# Patient Record
Sex: Male | Born: 1955
Health system: Southern US, Community
[De-identification: ages and names within clinical notes are randomized; demographics above are authoritative.]

## PROBLEM LIST (undated history)

## (undated) DIAGNOSIS — J439 Emphysema, unspecified: Secondary | ICD-10-CM

---

## 2006-04-04 ENCOUNTER — Ambulatory Visit: Payer: Self-pay | Admitting: Internal Medicine

## 2006-04-25 ENCOUNTER — Ambulatory Visit: Payer: Self-pay | Admitting: Internal Medicine

## 2006-05-17 ENCOUNTER — Ambulatory Visit: Payer: Self-pay | Admitting: Otolaryngology

## 2009-12-23 ENCOUNTER — Ambulatory Visit: Payer: Self-pay | Admitting: Surgery

## 2009-12-30 ENCOUNTER — Ambulatory Visit: Payer: Self-pay | Admitting: Family Medicine

## 2010-01-05 ENCOUNTER — Ambulatory Visit: Payer: Self-pay | Admitting: Surgery

## 2010-02-07 ENCOUNTER — Ambulatory Visit: Payer: Self-pay | Admitting: Surgery

## 2011-03-08 IMAGING — CR DG CHEST 1V PORT
1 series · 1 of 1 positions shown · non-contrast
Comparison: none

REASON FOR EXAM: f/u pneumonia, leukocytosis
COMMENTS:

PROCEDURE:     DXR - DXR PORTABLE CHEST SINGLE VIEW  - January 05, 2010  [DATE]
RESULT:     Comparison: 12/30/2009

[view not recorded]
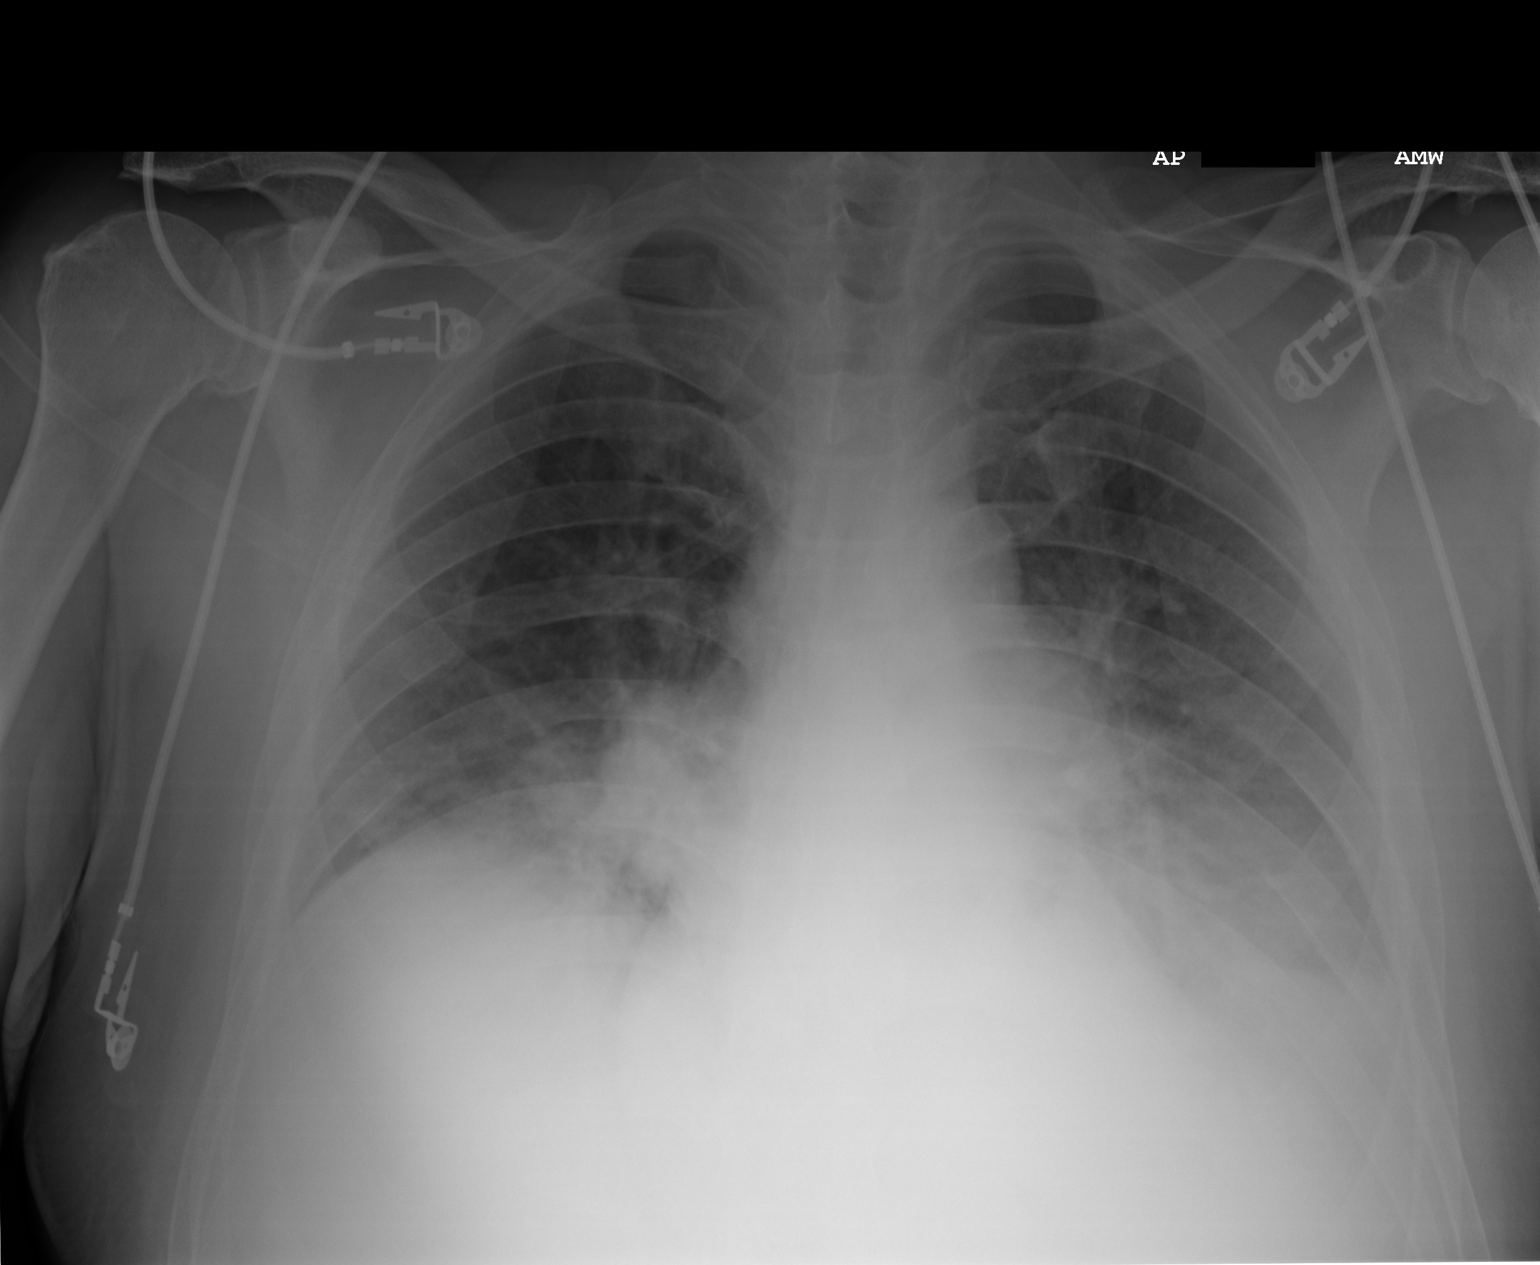

[1 of 1 positions shown; findings below may reference images not displayed]

FINDINGS: The cardiac silhouette is enlarged. There are perihilar and bibasilar
opacities. There is a small left pleural effusion, which is decreased in
size. There is prominence of the central pulmonary vessels.
IMPRESSION: Findings favored to represent pulmonary edema. Infection not excluded.

## 2014-12-15 ENCOUNTER — Other Ambulatory Visit: Payer: Self-pay | Admitting: Family Medicine

## 2014-12-15 DIAGNOSIS — Z87891 Personal history of nicotine dependence: Secondary | ICD-10-CM

## 2014-12-17 ENCOUNTER — Inpatient Hospital Stay: Payer: 59 | Attending: Family Medicine | Admitting: Family Medicine

## 2014-12-17 ENCOUNTER — Ambulatory Visit
Admission: RE | Admit: 2014-12-17 | Discharge: 2014-12-17 | Disposition: A | Discharge status: Home / Self Care | Payer: 59 | Source: Ambulatory Visit | Attending: Family Medicine | Admitting: Family Medicine

## 2014-12-17 DIAGNOSIS — F17210 Nicotine dependence, cigarettes, uncomplicated: Secondary | ICD-10-CM

## 2014-12-17 DIAGNOSIS — K760 Fatty (change of) liver, not elsewhere classified: Secondary | ICD-10-CM | POA: Insufficient documentation

## 2014-12-17 DIAGNOSIS — Z87891 Personal history of nicotine dependence: Secondary | ICD-10-CM

## 2014-12-17 DIAGNOSIS — C7931 Secondary malignant neoplasm of brain: Secondary | ICD-10-CM | POA: Diagnosis not present

## 2014-12-17 DIAGNOSIS — Z122 Encounter for screening for malignant neoplasm of respiratory organs: Secondary | ICD-10-CM | POA: Diagnosis not present

## 2014-12-17 DIAGNOSIS — Z85828 Personal history of other malignant neoplasm of skin: Secondary | ICD-10-CM | POA: Diagnosis not present

## 2014-12-17 DIAGNOSIS — R0602 Shortness of breath: Secondary | ICD-10-CM | POA: Insufficient documentation

## 2014-12-17 NOTE — Progress Notes (Signed)
In accordance with CMS guidelines, patient has meet eligibility criteria including age, absence of signs or symptoms of lung cancer, the specific calculation of cigarette smoking pack-years was 45 years and is a current smoker.   A shared decision-making session was conducted prior to the performance of CT scan. This includes one or more decision aids, includes benefits and harms of screening, follow-up diagnostic testing, over-diagnosis, false positive rate, and total radiation exposure.  Counseling on the importance of adherence to annual lung cancer LDCT screening, impact of co-morbidities, and ability or willingness to undergo diagnosis and treatment is imperative for compliance of the program.  Counseling on the importance of continued smoking cessation for former smokers; the importance of smoking cessation for current smokers and information about tobacco cessation interventions have been given to patient including the [place] at [facility], 1800 quit [place], as well as Cancer Center specific smoking cessation programs.  Written order for lung cancer screening with LDCT has been given to the patient and any and all questions have been answered to the best of my abilities.   Yearly follow up will be scheduled by Shawn Perkins, Thoracic Navigator.   

## 2014-12-20 ENCOUNTER — Telehealth: Payer: Self-pay | Admitting: *Deleted

## 2014-12-20 NOTE — Telephone Encounter (Signed)
  Oncology Nurse Navigator Documentation    Navigator Encounter Type: Telephone;Screening (12/20/14 1200)               Notified patient of LDCT lung cancer screening results of Lung Rads 2  finding with recommendation for 12 month follow up imaging. Also notified of incidental findings noted below.  IMPRESSION: 1. Mild centrilobular emphysema with pulmonary nodules measuring maximally 3 mm. Lung-RADS Category 2, benign appearance or behavior. Continue annual screening with low-dose chest CT without contrast in 12 months 2. Atherosclerosis, including within the coronary arteries. 3. Hepatic steatosis. 4. Moderate left hemidiaphragm elevation with improved adjacent left base volume loss/atelectasis. 5. Probable left adrenal adenoma.

## 2015-05-23 ENCOUNTER — Other Ambulatory Visit: Payer: Self-pay | Admitting: Internal Medicine

## 2015-05-23 DIAGNOSIS — I2510 Atherosclerotic heart disease of native coronary artery without angina pectoris: Secondary | ICD-10-CM

## 2015-05-23 DIAGNOSIS — R0602 Shortness of breath: Secondary | ICD-10-CM

## 2015-05-31 ENCOUNTER — Ambulatory Visit
Admission: RE | Admit: 2015-05-31 | Discharge: 2015-05-31 | Disposition: A | Discharge status: Home / Self Care | Payer: 59 | Source: Ambulatory Visit | Attending: Internal Medicine | Admitting: Internal Medicine

## 2015-05-31 ENCOUNTER — Other Ambulatory Visit: Payer: Self-pay | Admitting: Internal Medicine

## 2015-05-31 ENCOUNTER — Ambulatory Visit: Payer: 59

## 2015-05-31 DIAGNOSIS — I071 Rheumatic tricuspid insufficiency: Secondary | ICD-10-CM | POA: Insufficient documentation

## 2015-05-31 DIAGNOSIS — I340 Nonrheumatic mitral (valve) insufficiency: Secondary | ICD-10-CM | POA: Diagnosis not present

## 2015-05-31 DIAGNOSIS — R0602 Shortness of breath: Secondary | ICD-10-CM | POA: Insufficient documentation

## 2015-05-31 DIAGNOSIS — I2510 Atherosclerotic heart disease of native coronary artery without angina pectoris: Secondary | ICD-10-CM | POA: Diagnosis present

## 2015-05-31 NOTE — Progress Notes (Signed)
*  PRELIMINARY RESULTS* Echocardiogram 2D Echocardiogram has been performed.  Samuel Krause 05/31/2015, 10:39 AM

## 2015-06-01 ENCOUNTER — Encounter: Admission: RE | Admit: 2015-06-01 | Payer: 59 | Source: Ambulatory Visit

## 2015-09-22 DIAGNOSIS — E782 Mixed hyperlipidemia: Secondary | ICD-10-CM | POA: Diagnosis not present

## 2015-09-22 DIAGNOSIS — E669 Obesity, unspecified: Secondary | ICD-10-CM | POA: Diagnosis not present

## 2015-09-22 DIAGNOSIS — I2510 Atherosclerotic heart disease of native coronary artery without angina pectoris: Secondary | ICD-10-CM | POA: Diagnosis not present

## 2015-09-22 DIAGNOSIS — Z6831 Body mass index (BMI) 31.0-31.9, adult: Secondary | ICD-10-CM | POA: Diagnosis not present

## 2015-09-22 DIAGNOSIS — I10 Essential (primary) hypertension: Secondary | ICD-10-CM | POA: Diagnosis not present

## 2015-09-22 DIAGNOSIS — E1129 Type 2 diabetes mellitus with other diabetic kidney complication: Secondary | ICD-10-CM | POA: Diagnosis not present

## 2016-01-11 ENCOUNTER — Telehealth: Payer: Self-pay | Admitting: *Deleted

## 2016-01-11 NOTE — Telephone Encounter (Signed)
Left voicemail for patient notifyng them that it is time to schedule annual low dose lung cancer screening CT scan. Instructed patient to call back to verify information prior to the scan being scheduled.  

## 2016-01-16 ENCOUNTER — Telehealth: Payer: Self-pay | Admitting: *Deleted

## 2016-01-16 NOTE — Telephone Encounter (Signed)
Notified patient that annual lung cancer screening low dose CT scan is due. Confirmed that patient is within the age range of 55-77, and asymptomatic, (no signs or symptoms of lung cancer). Patient denies illness that would prevent curative treatment for lung cancer if found. The patient is a current smoker, with a 46 pack year history. The shared decision making visit was done 12/17/14. Patient is agreeable for CT scan being scheduled.

## 2016-01-27 ENCOUNTER — Other Ambulatory Visit: Payer: Self-pay | Admitting: *Deleted

## 2016-01-27 DIAGNOSIS — Z79899 Other long term (current) drug therapy: Secondary | ICD-10-CM | POA: Diagnosis not present

## 2016-01-27 DIAGNOSIS — E782 Mixed hyperlipidemia: Secondary | ICD-10-CM | POA: Diagnosis not present

## 2016-01-27 DIAGNOSIS — Z125 Encounter for screening for malignant neoplasm of prostate: Secondary | ICD-10-CM | POA: Diagnosis not present

## 2016-01-27 DIAGNOSIS — I10 Essential (primary) hypertension: Secondary | ICD-10-CM | POA: Diagnosis not present

## 2016-01-27 DIAGNOSIS — Z87891 Personal history of nicotine dependence: Secondary | ICD-10-CM

## 2016-01-27 DIAGNOSIS — Z6831 Body mass index (BMI) 31.0-31.9, adult: Secondary | ICD-10-CM | POA: Diagnosis not present

## 2016-01-27 DIAGNOSIS — E1129 Type 2 diabetes mellitus with other diabetic kidney complication: Secondary | ICD-10-CM | POA: Diagnosis not present

## 2016-01-27 DIAGNOSIS — I2510 Atherosclerotic heart disease of native coronary artery without angina pectoris: Secondary | ICD-10-CM | POA: Diagnosis not present

## 2016-02-15 ENCOUNTER — Ambulatory Visit
Admission: RE | Admit: 2016-02-15 | Discharge: 2016-02-15 | Disposition: A | Discharge status: Home / Self Care | Payer: 59 | Source: Ambulatory Visit | Attending: Oncology | Admitting: Oncology

## 2016-02-15 DIAGNOSIS — Z87891 Personal history of nicotine dependence: Secondary | ICD-10-CM | POA: Diagnosis not present

## 2016-02-15 DIAGNOSIS — R911 Solitary pulmonary nodule: Secondary | ICD-10-CM | POA: Diagnosis not present

## 2016-02-15 DIAGNOSIS — F17210 Nicotine dependence, cigarettes, uncomplicated: Secondary | ICD-10-CM | POA: Diagnosis not present

## 2016-02-17 ENCOUNTER — Telehealth: Payer: Self-pay | Admitting: *Deleted

## 2016-02-17 NOTE — Telephone Encounter (Signed)
Notified patient of LDCT lung cancer screening results with recommendation for 12 month follow up imaging. Also notified of incidental finding noted below. Patient verbalizes understanding.   IMPRESSION: Stable 2.1 mm subpleural nodule in the right upper lobe. Lung-RADS Category 2, benign appearance or behavior. Continue annual screening with low-dose chest CT without contrast in 12 months.

## 2016-04-27 DIAGNOSIS — Z9181 History of falling: Secondary | ICD-10-CM | POA: Diagnosis not present

## 2016-04-27 DIAGNOSIS — Z1389 Encounter for screening for other disorder: Secondary | ICD-10-CM | POA: Diagnosis not present

## 2016-04-27 DIAGNOSIS — E782 Mixed hyperlipidemia: Secondary | ICD-10-CM | POA: Diagnosis not present

## 2016-04-27 DIAGNOSIS — Z6831 Body mass index (BMI) 31.0-31.9, adult: Secondary | ICD-10-CM | POA: Diagnosis not present

## 2016-04-27 DIAGNOSIS — E1129 Type 2 diabetes mellitus with other diabetic kidney complication: Secondary | ICD-10-CM | POA: Diagnosis not present

## 2016-04-27 DIAGNOSIS — I10 Essential (primary) hypertension: Secondary | ICD-10-CM | POA: Diagnosis not present

## 2016-04-27 DIAGNOSIS — I2510 Atherosclerotic heart disease of native coronary artery without angina pectoris: Secondary | ICD-10-CM | POA: Diagnosis not present

## 2016-08-31 DIAGNOSIS — Z6832 Body mass index (BMI) 32.0-32.9, adult: Secondary | ICD-10-CM | POA: Diagnosis not present

## 2016-08-31 DIAGNOSIS — E782 Mixed hyperlipidemia: Secondary | ICD-10-CM | POA: Diagnosis not present

## 2016-08-31 DIAGNOSIS — Z720 Tobacco use: Secondary | ICD-10-CM | POA: Diagnosis not present

## 2016-08-31 DIAGNOSIS — I2510 Atherosclerotic heart disease of native coronary artery without angina pectoris: Secondary | ICD-10-CM | POA: Diagnosis not present

## 2016-08-31 DIAGNOSIS — E1129 Type 2 diabetes mellitus with other diabetic kidney complication: Secondary | ICD-10-CM | POA: Diagnosis not present

## 2016-08-31 DIAGNOSIS — I10 Essential (primary) hypertension: Secondary | ICD-10-CM | POA: Diagnosis not present

## 2017-01-01 DIAGNOSIS — J449 Chronic obstructive pulmonary disease, unspecified: Secondary | ICD-10-CM | POA: Diagnosis not present

## 2017-01-01 DIAGNOSIS — E1129 Type 2 diabetes mellitus with other diabetic kidney complication: Secondary | ICD-10-CM | POA: Diagnosis not present

## 2017-01-01 DIAGNOSIS — Z1389 Encounter for screening for other disorder: Secondary | ICD-10-CM | POA: Diagnosis not present

## 2017-01-01 DIAGNOSIS — I10 Essential (primary) hypertension: Secondary | ICD-10-CM | POA: Diagnosis not present

## 2017-01-01 DIAGNOSIS — I2510 Atherosclerotic heart disease of native coronary artery without angina pectoris: Secondary | ICD-10-CM | POA: Diagnosis not present

## 2017-01-01 DIAGNOSIS — Z720 Tobacco use: Secondary | ICD-10-CM | POA: Diagnosis not present

## 2017-01-01 DIAGNOSIS — E782 Mixed hyperlipidemia: Secondary | ICD-10-CM | POA: Diagnosis not present

## 2017-02-05 ENCOUNTER — Telehealth: Payer: Self-pay | Admitting: *Deleted

## 2017-02-05 DIAGNOSIS — Z87891 Personal history of nicotine dependence: Secondary | ICD-10-CM

## 2017-02-05 DIAGNOSIS — Z122 Encounter for screening for malignant neoplasm of respiratory organs: Secondary | ICD-10-CM

## 2017-02-05 NOTE — Telephone Encounter (Signed)
Notified patient that annual lung cancer screening low dose CT scan is due currently or will be in near future. Confirmed that patient is within the age range of 55-77, and asymptomatic, (no signs or symptoms of lung cancer). Patient denies illness that would prevent curative treatment for lung cancer if found. Verified smoking history, (current, 47 pack year). The shared decision making visit was done 12/17/14. Patient is agreeable for CT scan being scheduled.

## 2017-02-05 NOTE — Telephone Encounter (Signed)
Left message for patient to notify them that it is time to schedule annual low dose lung cancer screening CT scan. Instructed patient to call back to verify information prior to the scan being scheduled.  

## 2017-02-21 ENCOUNTER — Ambulatory Visit
Admission: RE | Admit: 2017-02-21 | Discharge: 2017-02-21 | Disposition: A | Discharge status: Home / Self Care | Payer: 59 | Source: Ambulatory Visit | Attending: Oncology | Admitting: Oncology

## 2017-02-21 DIAGNOSIS — I2510 Atherosclerotic heart disease of native coronary artery without angina pectoris: Secondary | ICD-10-CM | POA: Diagnosis not present

## 2017-02-21 DIAGNOSIS — J986 Disorders of diaphragm: Secondary | ICD-10-CM | POA: Insufficient documentation

## 2017-02-21 DIAGNOSIS — I700 Atherosclerosis of aorta: Secondary | ICD-10-CM | POA: Diagnosis not present

## 2017-02-21 DIAGNOSIS — Z122 Encounter for screening for malignant neoplasm of respiratory organs: Secondary | ICD-10-CM | POA: Insufficient documentation

## 2017-02-21 DIAGNOSIS — F17210 Nicotine dependence, cigarettes, uncomplicated: Secondary | ICD-10-CM | POA: Diagnosis not present

## 2017-02-21 DIAGNOSIS — Z87891 Personal history of nicotine dependence: Secondary | ICD-10-CM | POA: Diagnosis not present

## 2017-02-22 ENCOUNTER — Telehealth: Payer: Self-pay | Admitting: *Deleted

## 2017-02-22 NOTE — Telephone Encounter (Signed)
Notified patient of LDCT lung cancer screening program results with recommendation for 12 month follow up imaging. Also notified of incidental findings noted below and is encouraged to discuss further with PCP who will receive a copy of this note and/or the CT report. Patient verbalizes understanding.   IMPRESSION: 1. Lung-RADS 2, benign appearance or behavior. Continue annual screening with low-dose chest CT without contrast in 12 months. 2.  Aortic Atherosclerosis (ICD10-I70.0). 3. Multi vessel coronary artery calcification 4. Chronic asymmetric elevation of the left hemidiaphragm.

## 2017-05-03 DIAGNOSIS — I10 Essential (primary) hypertension: Secondary | ICD-10-CM | POA: Diagnosis not present

## 2017-05-03 DIAGNOSIS — I2510 Atherosclerotic heart disease of native coronary artery without angina pectoris: Secondary | ICD-10-CM | POA: Diagnosis not present

## 2017-05-03 DIAGNOSIS — Z125 Encounter for screening for malignant neoplasm of prostate: Secondary | ICD-10-CM | POA: Diagnosis not present

## 2017-05-03 DIAGNOSIS — E782 Mixed hyperlipidemia: Secondary | ICD-10-CM | POA: Diagnosis not present

## 2017-05-03 DIAGNOSIS — E1129 Type 2 diabetes mellitus with other diabetic kidney complication: Secondary | ICD-10-CM | POA: Diagnosis not present

## 2017-05-03 DIAGNOSIS — Z720 Tobacco use: Secondary | ICD-10-CM | POA: Diagnosis not present

## 2017-05-28 DIAGNOSIS — E669 Obesity, unspecified: Secondary | ICD-10-CM | POA: Diagnosis not present

## 2017-05-28 DIAGNOSIS — J069 Acute upper respiratory infection, unspecified: Secondary | ICD-10-CM | POA: Diagnosis not present

## 2017-05-28 DIAGNOSIS — J441 Chronic obstructive pulmonary disease with (acute) exacerbation: Secondary | ICD-10-CM | POA: Diagnosis not present

## 2017-05-28 DIAGNOSIS — Z6831 Body mass index (BMI) 31.0-31.9, adult: Secondary | ICD-10-CM | POA: Diagnosis not present

## 2017-09-04 DIAGNOSIS — E782 Mixed hyperlipidemia: Secondary | ICD-10-CM | POA: Diagnosis not present

## 2017-09-04 DIAGNOSIS — E1129 Type 2 diabetes mellitus with other diabetic kidney complication: Secondary | ICD-10-CM | POA: Diagnosis not present

## 2017-09-04 DIAGNOSIS — I10 Essential (primary) hypertension: Secondary | ICD-10-CM | POA: Diagnosis not present

## 2017-09-04 DIAGNOSIS — Z79899 Other long term (current) drug therapy: Secondary | ICD-10-CM | POA: Diagnosis not present

## 2017-09-04 DIAGNOSIS — Z720 Tobacco use: Secondary | ICD-10-CM | POA: Diagnosis not present

## 2017-09-04 DIAGNOSIS — H04551 Acquired stenosis of right nasolacrimal duct: Secondary | ICD-10-CM | POA: Diagnosis not present

## 2017-09-04 DIAGNOSIS — I2510 Atherosclerotic heart disease of native coronary artery without angina pectoris: Secondary | ICD-10-CM | POA: Diagnosis not present

## 2017-11-07 DIAGNOSIS — Z0000 Encounter for general adult medical examination without abnormal findings: Secondary | ICD-10-CM | POA: Diagnosis not present

## 2017-11-07 DIAGNOSIS — J449 Chronic obstructive pulmonary disease, unspecified: Secondary | ICD-10-CM | POA: Diagnosis not present

## 2017-11-07 DIAGNOSIS — Z6831 Body mass index (BMI) 31.0-31.9, adult: Secondary | ICD-10-CM | POA: Diagnosis not present

## 2017-11-11 DIAGNOSIS — Z1211 Encounter for screening for malignant neoplasm of colon: Secondary | ICD-10-CM | POA: Diagnosis not present

## 2018-01-10 DIAGNOSIS — Z1331 Encounter for screening for depression: Secondary | ICD-10-CM | POA: Diagnosis not present

## 2018-01-10 DIAGNOSIS — Z6832 Body mass index (BMI) 32.0-32.9, adult: Secondary | ICD-10-CM | POA: Diagnosis not present

## 2018-01-10 DIAGNOSIS — E782 Mixed hyperlipidemia: Secondary | ICD-10-CM | POA: Diagnosis not present

## 2018-01-10 DIAGNOSIS — Z1339 Encounter for screening examination for other mental health and behavioral disorders: Secondary | ICD-10-CM | POA: Diagnosis not present

## 2018-01-10 DIAGNOSIS — Z87891 Personal history of nicotine dependence: Secondary | ICD-10-CM | POA: Diagnosis not present

## 2018-01-10 DIAGNOSIS — I2510 Atherosclerotic heart disease of native coronary artery without angina pectoris: Secondary | ICD-10-CM | POA: Diagnosis not present

## 2018-01-10 DIAGNOSIS — I10 Essential (primary) hypertension: Secondary | ICD-10-CM | POA: Diagnosis not present

## 2018-01-10 DIAGNOSIS — Z79899 Other long term (current) drug therapy: Secondary | ICD-10-CM | POA: Diagnosis not present

## 2018-01-10 DIAGNOSIS — E1129 Type 2 diabetes mellitus with other diabetic kidney complication: Secondary | ICD-10-CM | POA: Diagnosis not present

## 2018-02-25 ENCOUNTER — Telehealth: Payer: Self-pay | Admitting: Nurse Practitioner

## 2018-02-26 ENCOUNTER — Telehealth: Payer: Self-pay | Admitting: *Deleted

## 2018-02-26 DIAGNOSIS — Z87891 Personal history of nicotine dependence: Secondary | ICD-10-CM

## 2018-02-26 DIAGNOSIS — Z122 Encounter for screening for malignant neoplasm of respiratory organs: Secondary | ICD-10-CM

## 2018-02-26 NOTE — Telephone Encounter (Signed)
Patient has been notified that annual lung cancer screening low dose CT scan is due currently or will be in near future. Confirmed that patient is within the age range of 55-77, and asymptomatic, (no signs or symptoms of lung cancer). Patient denies illness that would prevent curative treatment for lung cancer if found. Verified smoking history, (current, 48 pack year). The shared decision making visit was done 12/17/14. Patient is agreeable for CT scan being scheduled.

## 2018-02-28 ENCOUNTER — Ambulatory Visit
Admission: RE | Admit: 2018-02-28 | Discharge: 2018-02-28 | Disposition: A | Discharge status: Home / Self Care | Payer: 59 | Source: Ambulatory Visit | Attending: Oncology | Admitting: Oncology

## 2018-02-28 DIAGNOSIS — J984 Other disorders of lung: Secondary | ICD-10-CM | POA: Diagnosis not present

## 2018-02-28 DIAGNOSIS — Z122 Encounter for screening for malignant neoplasm of respiratory organs: Secondary | ICD-10-CM | POA: Insufficient documentation

## 2018-02-28 DIAGNOSIS — I700 Atherosclerosis of aorta: Secondary | ICD-10-CM | POA: Diagnosis not present

## 2018-02-28 DIAGNOSIS — J439 Emphysema, unspecified: Secondary | ICD-10-CM | POA: Diagnosis not present

## 2018-02-28 DIAGNOSIS — Z87891 Personal history of nicotine dependence: Secondary | ICD-10-CM | POA: Diagnosis not present

## 2018-02-28 DIAGNOSIS — F17210 Nicotine dependence, cigarettes, uncomplicated: Secondary | ICD-10-CM | POA: Diagnosis not present

## 2018-03-03 ENCOUNTER — Encounter: Payer: Self-pay | Admitting: *Deleted

## 2018-03-04 DIAGNOSIS — J441 Chronic obstructive pulmonary disease with (acute) exacerbation: Secondary | ICD-10-CM | POA: Diagnosis not present

## 2018-04-08 DIAGNOSIS — M5416 Radiculopathy, lumbar region: Secondary | ICD-10-CM | POA: Diagnosis not present

## 2018-04-23 ENCOUNTER — Other Ambulatory Visit: Payer: Self-pay | Admitting: Physician Assistant

## 2018-04-23 DIAGNOSIS — M545 Low back pain, unspecified: Secondary | ICD-10-CM

## 2018-04-28 ENCOUNTER — Ambulatory Visit
Admission: RE | Admit: 2018-04-28 | Discharge: 2018-04-28 | Disposition: A | Discharge status: Home / Self Care | Payer: 59 | Source: Ambulatory Visit | Attending: Physician Assistant | Admitting: Physician Assistant

## 2018-04-28 DIAGNOSIS — M5126 Other intervertebral disc displacement, lumbar region: Secondary | ICD-10-CM | POA: Diagnosis not present

## 2018-04-28 DIAGNOSIS — M5416 Radiculopathy, lumbar region: Secondary | ICD-10-CM | POA: Diagnosis present

## 2018-04-28 DIAGNOSIS — M48061 Spinal stenosis, lumbar region without neurogenic claudication: Secondary | ICD-10-CM | POA: Diagnosis not present

## 2018-04-28 DIAGNOSIS — M5117 Intervertebral disc disorders with radiculopathy, lumbosacral region: Secondary | ICD-10-CM | POA: Diagnosis not present

## 2018-04-28 DIAGNOSIS — M545 Low back pain, unspecified: Secondary | ICD-10-CM

## 2018-05-12 DIAGNOSIS — E1129 Type 2 diabetes mellitus with other diabetic kidney complication: Secondary | ICD-10-CM | POA: Diagnosis not present

## 2018-05-12 DIAGNOSIS — E782 Mixed hyperlipidemia: Secondary | ICD-10-CM | POA: Diagnosis not present

## 2018-05-12 DIAGNOSIS — I2510 Atherosclerotic heart disease of native coronary artery without angina pectoris: Secondary | ICD-10-CM | POA: Diagnosis not present

## 2018-05-12 DIAGNOSIS — Z79899 Other long term (current) drug therapy: Secondary | ICD-10-CM | POA: Diagnosis not present

## 2018-05-12 DIAGNOSIS — Z2821 Immunization not carried out because of patient refusal: Secondary | ICD-10-CM | POA: Diagnosis not present

## 2018-05-12 DIAGNOSIS — I10 Essential (primary) hypertension: Secondary | ICD-10-CM | POA: Diagnosis not present

## 2018-05-12 DIAGNOSIS — M5116 Intervertebral disc disorders with radiculopathy, lumbar region: Secondary | ICD-10-CM | POA: Diagnosis not present

## 2018-05-13 ENCOUNTER — Ambulatory Visit: Payer: 59

## 2018-07-09 ENCOUNTER — Emergency Department: Payer: No Typology Code available for payment source

## 2018-07-09 ENCOUNTER — Encounter: Payer: Self-pay | Admitting: Emergency Medicine

## 2018-07-09 ENCOUNTER — Inpatient Hospital Stay
Admission: EM | Admit: 2018-07-09 | Discharge: 2018-07-10 | DRG: 202 | Discharge status: Against Medical Advice | Payer: No Typology Code available for payment source | Attending: Internal Medicine | Admitting: Internal Medicine

## 2018-07-09 ENCOUNTER — Other Ambulatory Visit: Payer: Self-pay

## 2018-07-09 DIAGNOSIS — I10 Essential (primary) hypertension: Secondary | ICD-10-CM | POA: Diagnosis not present

## 2018-07-09 DIAGNOSIS — Z79899 Other long term (current) drug therapy: Secondary | ICD-10-CM | POA: Diagnosis not present

## 2018-07-09 DIAGNOSIS — R0902 Hypoxemia: Secondary | ICD-10-CM | POA: Diagnosis present

## 2018-07-09 DIAGNOSIS — F17210 Nicotine dependence, cigarettes, uncomplicated: Secondary | ICD-10-CM

## 2018-07-09 DIAGNOSIS — J101 Influenza due to other identified influenza virus with other respiratory manifestations: Secondary | ICD-10-CM | POA: Diagnosis present

## 2018-07-09 DIAGNOSIS — J441 Chronic obstructive pulmonary disease with (acute) exacerbation: Secondary | ICD-10-CM | POA: Diagnosis present

## 2018-07-09 DIAGNOSIS — J209 Acute bronchitis, unspecified: Principal | ICD-10-CM | POA: Diagnosis present

## 2018-07-09 DIAGNOSIS — J440 Chronic obstructive pulmonary disease with acute lower respiratory infection: Secondary | ICD-10-CM | POA: Diagnosis present

## 2018-07-09 DIAGNOSIS — J111 Influenza due to unidentified influenza virus with other respiratory manifestations: Secondary | ICD-10-CM

## 2018-07-09 DIAGNOSIS — Z716 Tobacco abuse counseling: Secondary | ICD-10-CM | POA: Diagnosis not present

## 2018-07-09 DIAGNOSIS — J40 Bronchitis, not specified as acute or chronic: Secondary | ICD-10-CM | POA: Diagnosis present

## 2018-07-09 DIAGNOSIS — I959 Hypotension, unspecified: Secondary | ICD-10-CM | POA: Diagnosis present

## 2018-07-09 DIAGNOSIS — Z7984 Long term (current) use of oral hypoglycemic drugs: Secondary | ICD-10-CM

## 2018-07-09 DIAGNOSIS — A419 Sepsis, unspecified organism: Secondary | ICD-10-CM

## 2018-07-09 HISTORY — DX: Emphysema, unspecified: J43.9

## 2018-07-09 LAB — COMPREHENSIVE METABOLIC PANEL
ALK PHOS: 62 U/L (ref 38–126)
ALT: 19 U/L (ref 0–44)
AST: 26 U/L (ref 15–41)
Albumin: 3.7 g/dL (ref 3.5–5.0)
Anion gap: 10 (ref 5–15)
BUN: 46 mg/dL — ABNORMAL HIGH (ref 8–23)
CALCIUM: 8.7 mg/dL — AB (ref 8.9–10.3)
CO2: 30 mmol/L (ref 22–32)
CREATININE: 1.77 mg/dL — AB (ref 0.61–1.24)
Chloride: 95 mmol/L — ABNORMAL LOW (ref 98–111)
GFR calc Af Amer: 46 mL/min — ABNORMAL LOW (ref >60)
GFR calc non Af Amer: 40 mL/min — ABNORMAL LOW (ref >60)
Glucose, Bld: 145 mg/dL — ABNORMAL HIGH (ref 70–99)
Potassium: 3.3 mmol/L — ABNORMAL LOW (ref 3.5–5.1)
Sodium: 135 mmol/L (ref 135–145)
Total Bilirubin: 0.9 mg/dL (ref 0.3–1.2)
Total Protein: 7.6 g/dL (ref 6.5–8.1)

## 2018-07-09 LAB — CBC WITH DIFFERENTIAL/PLATELET
Abs Immature Granulocytes: 0.08 10*3/uL — ABNORMAL HIGH (ref 0.00–0.07)
Basophils Absolute: 0 10*3/uL (ref 0.0–0.1)
Basophils Relative: 0 %
Eosinophils Absolute: 0 10*3/uL (ref 0.0–0.5)
Eosinophils Relative: 0 %
HCT: 43.9 % (ref 39.0–52.0)
Hemoglobin: 14.7 g/dL (ref 13.0–17.0)
Immature Granulocytes: 1 %
Lymphocytes Relative: 11 %
Lymphs Abs: 1.3 10*3/uL (ref 0.7–4.0)
MCH: 29.8 pg (ref 26.0–34.0)
MCHC: 33.5 g/dL (ref 30.0–36.0)
MCV: 88.9 fL (ref 80.0–100.0)
Monocytes Absolute: 2 10*3/uL — ABNORMAL HIGH (ref 0.1–1.0)
Monocytes Relative: 17 %
Neutro Abs: 8.1 10*3/uL — ABNORMAL HIGH (ref 1.7–7.7)
Neutrophils Relative %: 71 %
Platelets: 127 10*3/uL — ABNORMAL LOW (ref 150–400)
RBC: 4.94 MIL/uL (ref 4.22–5.81)
RDW: 13.6 % (ref 11.5–15.5)
WBC: 11.5 10*3/uL — ABNORMAL HIGH (ref 4.0–10.5)
nRBC: 0 % (ref 0.0–0.2)

## 2018-07-09 LAB — LACTIC ACID, PLASMA: Lactic Acid, Venous: 1.3 mmol/L (ref 0.5–1.9)

## 2018-07-09 LAB — TROPONIN I: Troponin I: <0.03 ng/mL (ref <0.03)

## 2018-07-09 LAB — PROCALCITONIN: Procalcitonin: 0.4 ng/mL

## 2018-07-09 MED ORDER — ONDANSETRON HCL 4 MG PO TABS: 4.0000 mg | ORAL_TABLET | Freq: Four times a day (QID) | ORAL | Status: DC | PRN

## 2018-07-09 MED ORDER — DM-GUAIFENESIN ER 30-600 MG PO TB12: 1.0000 | ORAL_TABLET | Freq: Two times a day (BID) | ORAL | Status: DC

## 2018-07-09 MED ORDER — SODIUM CHLORIDE 0.9 % IV BOLUS (SEPSIS)
1000.0000 mL | Freq: Once | INTRAVENOUS | Status: AC
  Administered 2018-07-09: 1000 mL via INTRAVENOUS

## 2018-07-09 MED ORDER — ATORVASTATIN CALCIUM 20 MG PO TABS: 20.0000 mg | ORAL_TABLET | Freq: Every day | ORAL | Status: DC

## 2018-07-09 MED ORDER — IPRATROPIUM-ALBUTEROL 0.5-2.5 (3) MG/3ML IN SOLN
9.0000 mL | Freq: Once | RESPIRATORY_TRACT | Status: AC
  Administered 2018-07-09: 9 mL via RESPIRATORY_TRACT
  Filled 2018-07-09: qty 9

## 2018-07-09 MED ORDER — ALBUTEROL SULFATE (2.5 MG/3ML) 0.083% IN NEBU: 2.5000 mg | INHALATION_SOLUTION | RESPIRATORY_TRACT | Status: DC | PRN

## 2018-07-09 MED ORDER — LINAGLIPTIN 5 MG PO TABS
5.0000 mg | ORAL_TABLET | Freq: Every day | ORAL | Status: DC
  Administered 2018-07-10: 5 mg via ORAL
  Filled 2018-07-09: qty 1

## 2018-07-09 MED ORDER — DEXTROMETHORPHAN POLISTIREX ER 30 MG/5ML PO SUER
30.0000 mg | Freq: Two times a day (BID) | ORAL | Status: DC
  Administered 2018-07-10 (×2): 30 mg via ORAL
  Filled 2018-07-09 (×3): qty 5

## 2018-07-09 MED ORDER — ACETAMINOPHEN 325 MG PO TABS: 650.0000 mg | ORAL_TABLET | Freq: Four times a day (QID) | ORAL | Status: DC | PRN

## 2018-07-09 MED ORDER — ENOXAPARIN SODIUM 40 MG/0.4ML ~~LOC~~ SOLN
40.0000 mg | SUBCUTANEOUS | Status: DC
  Administered 2018-07-10: 40 mg via SUBCUTANEOUS
  Filled 2018-07-09: qty 0.4

## 2018-07-09 MED ORDER — GUAIFENESIN ER 600 MG PO TB12
600.0000 mg | ORAL_TABLET | Freq: Two times a day (BID) | ORAL | Status: DC
  Administered 2018-07-10 (×2): 600 mg via ORAL
  Filled 2018-07-09 (×2): qty 1

## 2018-07-09 MED ORDER — LEVOFLOXACIN 500 MG PO TABS: 500.0000 mg | ORAL_TABLET | Freq: Every day | ORAL | Status: DC

## 2018-07-09 MED ORDER — PREDNISONE 50 MG PO TABS
50.0000 mg | ORAL_TABLET | Freq: Every day | ORAL | Status: DC
  Administered 2018-07-10: 09:00:00 50 mg via ORAL
  Filled 2018-07-09: qty 1

## 2018-07-09 MED ORDER — METHYLPREDNISOLONE SODIUM SUCC 125 MG IJ SOLR
125.0000 mg | Freq: Once | INTRAMUSCULAR | Status: AC
  Administered 2018-07-09: 125 mg via INTRAVENOUS
  Filled 2018-07-09: qty 2

## 2018-07-09 MED ORDER — SODIUM CHLORIDE 0.9 % IV SOLN
2.0000 g | Freq: Two times a day (BID) | INTRAVENOUS | Status: DC
  Filled 2018-07-09 (×2): qty 2

## 2018-07-09 MED ORDER — SODIUM CHLORIDE 0.9 % IV SOLN
2.0000 g | Freq: Once | INTRAVENOUS | Status: AC
  Administered 2018-07-09: 2 g via INTRAVENOUS
  Filled 2018-07-09: qty 2

## 2018-07-09 MED ORDER — ACETAMINOPHEN 650 MG RE SUPP: 650.0000 mg | Freq: Four times a day (QID) | RECTAL | Status: DC | PRN

## 2018-07-09 MED ORDER — ONDANSETRON HCL 4 MG/2ML IJ SOLN: 4.0000 mg | Freq: Four times a day (QID) | INTRAMUSCULAR | Status: DC | PRN

## 2018-07-09 MED ORDER — SODIUM CHLORIDE 0.9 % IV SOLN
500.0000 mg | INTRAVENOUS | Status: DC
  Administered 2018-07-09: 500 mg via INTRAVENOUS
  Filled 2018-07-09: qty 500

## 2018-07-09 NOTE — ED Provider Notes (Signed)
[facility] Emergency Department Provider Note  ____________________________________________   First MD Initiated Contact with Patient 07/09/18 1928     (approximate)  I have reviewed the triage vital signs and the nursing notes.   HISTORY  Chief Complaint Influenza    HPI Samuel Krause is a 63 y.o. male with a history of COPD who is presented emergency department today with 6 days of productive cough as well as shortness of breath.  He says that he also was diagnosed with flu earlier today at his 63 office who wanted him to come to the emergency department for further evaluation for pneumonia.  Patient says that he has a cough productive of green sputum.  States that he was not started on Tamiflu because he was outside of the window, having 6 days of symptoms.  Patient also reports that he is still a 1-1/2 pack/day smoker.  Denying any pain at this time.  Denies any history of CHF.   Past Medical History:  Diagnosis Date  . Emphysema lung ([place])     There are no active problems to display for this patient.   History reviewed. No pertinent surgical history.  Prior to Admission medications   Medication Sig Start Date End Date Taking? Authorizing Provider  amLODipine (NORVASC) 5 MG tablet Take 5 mg by mouth daily. 05/12/18  Yes [provider]  atorvastatin (LIPITOR) 20 MG tablet Take 20 mg by mouth daily.   Yes [provider]  carvedilol (COREG) 12.5 MG tablet Take 12.5 mg by mouth 2 (two) times daily. 04/02/18  Yes [provider]  losartan-hydrochlorothiazide (HYZAAR) 100-25 MG tablet Take 1 tablet by mouth daily. 05/22/18  Yes [provider]  sitaGLIPtin (JANUVIA) 100 MG tablet Take 100 mg by mouth daily.   Yes [provider]    Allergies Patient has no known allergies.  No family history on file.  Social History Social History   Tobacco Use  . Smoking status: Current Every Day Smoker    . Smokeless tobacco: Never Used  Substance Use Topics  . Alcohol use: Not on file  . Drug use: Not on file    Review of Systems  Constitutional: No fever/chills Eyes: No visual changes. ENT: No sore throat. Cardiovascular: Denies chest pain. Respiratory: As above Gastrointestinal: No abdominal pain.  No nausea, no vomiting.  No diarrhea.  No constipation. Genitourinary: Negative for dysuria. Musculoskeletal: Negative for back pain. Skin: Negative for rash. Neurological: Negative for headaches, focal weakness or numbness.   ____________________________________________   PHYSICAL EXAM:  VITAL SIGNS: ED Triage Vitals [07/09/18 1916]  Enc Vitals Group     BP 109/62     Pulse Rate 70     Resp (!) 24     Temp 98.5 F (36.9 C)     Temp Source Oral     SpO2 (!) 87 %     Weight 191 lb (86.6 kg)     Height 5\' 7"  (1.702 m)     Head Circumference      Peak Flow      Pain Score 0     Pain Loc      Pain Edu?      Excl. in [place]?     Constitutional: Alert and oriented.  Patient appears weak and ill appearing. Eyes: Conjunctivae are normal.  Head: Atraumatic. Nose: No congestion/rhinnorhea. Mouth/Throat: Mucous membranes are moist.  Neck: No stridor.   Cardiovascular: Normal rate, regular rhythm. Grossly normal heart sounds.  Respiratory: Labored respirations with decreased air movement throughout all fields.  Coarse wheezing throughout all fields.  Also with expiratory cough. Gastrointestinal: Soft and nontender. No distention.  Musculoskeletal: No lower extremity tenderness nor edema.  No joint effusions. Neurologic:  Normal speech and language. No gross focal neurologic deficits are appreciated. Skin:  Skin is warm, dry and intact. No rash noted. Psychiatric: Mood and affect are normal. Speech and behavior are normal.  ____________________________________________   LABS (all labs ordered are listed, but only abnormal results are displayed)  Labs Reviewed   COMPREHENSIVE METABOLIC PANEL - Abnormal; Notable for the following components:      Result Value   Potassium 3.3 (*)    Chloride 95 (*)    Glucose, Bld 145 (*)    BUN 46 (*)    Creatinine, Ser 1.77 (*)    Calcium 8.7 (*)    GFR calc non Af Amer 40 (*)    GFR calc Af Amer 46 (*)    All other components within normal limits  CBC WITH DIFFERENTIAL/PLATELET - Abnormal; Notable for the following components:   WBC 11.5 (*)    Platelets 127 (*)    Neutro Abs 8.1 (*)    Monocytes Absolute 2.0 (*)    Abs Immature Granulocytes 0.08 (*)    All other components within normal limits  CULTURE, BLOOD (ROUTINE X 2)  CULTURE, BLOOD (ROUTINE X 2)  LACTIC ACID, PLASMA  TROPONIN I  LACTIC ACID, PLASMA  URINALYSIS, ROUTINE W REFLEX MICROSCOPIC  PROCALCITONIN   ____________________________________________  EKG  ED ECG REPORT I, Doran Stabler, the attending physician, personally viewed and interpreted this ECG.   Date: 07/09/2018  EKG Time: 1926  Rate: 66  Rhythm: normal sinus rhythm  Axis: Normal  Intervals:none  ST&T Change: No ST segment elevation or depression.  Single T wave inversion in aVL.  ____________________________________________  RADIOLOGY  Mild bronchitic change without focal consolidation on the chest x-ray. ____________________________________________   PROCEDURES  Procedure(s) performed:   .Critical Care Performed by: Orbie Pyo, MD Authorized by: Orbie Pyo, MD   Critical care provider statement:    Critical care time (minutes):  35   Critical care time was exclusive of:  Separately billable procedures and treating other patients   Critical care was necessary to treat or prevent imminent or life-threatening deterioration of the following conditions:  Sepsis and respiratory failure   Critical care was time spent personally by me on the following activities:  Development of treatment plan with patient or surrogate, discussions  with consultants, evaluation of patient's response to treatment, examination of patient, obtaining history from patient or surrogate, ordering and performing treatments and interventions, ordering and review of laboratory studies, ordering and review of radiographic studies, pulse oximetry, re-evaluation of patient's condition and review of old charts    Critical Care performed:   ____________________________________________   INITIAL IMPRESSION / ASSESSMENT AND PLAN / ED COURSE  Pertinent labs & imaging results that were available during my care of the patient were reviewed by me and considered in my medical decision making (see chart for details).  Differential includes, but is not limited to, viral syndrome, bronchitis including COPD exacerbation, pneumonia, reactive airway disease including asthma, CHF including exacerbation with or without pulmonary/interstitial edema, pneumothorax, ACS, thoracic trauma, and pulmonary embolism. As part of my medical decision making, I reviewed the following data within the electronic MEDICAL RECORD NUMBER Notes from prior ED visits  ----------------------------------------- 9:30 PM on 07/09/2018 -----------------------------------------  Patient  at this time with improved air movement and lateral wheezing.  Had an episode of hypotension with a systolic blood pressure of 87.  Sepsis bundle initiated and given broad-spectrum antibiotics.  Patient will be admitted to the hospital.  Signed out to Dr. Posey Pronto.  Patient aware of diagnosis well treatment and willing to comply. ____________________________________________   FINAL CLINICAL IMPRESSION(S) / ED DIAGNOSES  Sepsis.  Influenza.  Hypoxia.  COPD laceration.  NEW MEDICATIONS STARTED DURING THIS VISIT:  New Prescriptions   No medications on file     Note:  This document was prepared using Dragon voice recognition software and may include unintentional dictation errors.     Orbie Pyo, MD 07/09/18 2131

## 2018-07-09 NOTE — Consult Note (Signed)
Pharmacy Antibiotic Note  Samuel Krause is a 63 y.o. male admitted on 07/09/2018 with pneumonia.  Pharmacy has been consulted for Cefepime dosing.  Plan: Cefepime 2 g q 12h   Height: 5\' 7"  (170.2 cm) Weight: 191 lb (86.6 kg) IBW/kg (Calculated) : 66.1  Temp (24hrs), Avg:98.5 F (36.9 C), Min:98.5 F (36.9 C), Max:98.5 F (36.9 C)  Recent Labs  Lab 07/09/18 1932  WBC 11.5*  CREATININE 1.77*    Estimated Creatinine Clearance: 44.3 mL/min (A) (by C-G formula based on SCr of 1.77 mg/dL (H)).    No Known Allergies  Antimicrobials this admission: 1/29 Azithromycin >> 1.29 Cefepime >>  Dose adjustments this admission: None  Microbiology results: 1/29 BCx: pending  Thank you for allowing pharmacy to be a part of this patient's care.  Lu Duffel, PharmD, BCPS Clinical Pharmacist 07/09/2018 8:32 PM

## 2018-07-09 NOTE — ED Notes (Signed)
Admitting Provider at bedside. 

## 2018-07-09 NOTE — Consult Note (Signed)
CODE SEPSIS - PHARMACY COMMUNICATION  **Broad Spectrum Antibiotics should be administered within 1 hour of Sepsis diagnosis**  Time Code Sepsis Called/Page Received: 1940  Antibiotics Ordered: Cefepime/Azithromycin  Time of 1st antibiotic administration: 2034  Additional action taken by pharmacy: Spoke with Wells Guiles (she was making it happen)  If necessary, Name of Provider/Nurse Contacted: Gloriann Loan, PharmD, BCPS Clinical Pharmacist 07/09/2018 8:35 PM

## 2018-07-09 NOTE — ED Triage Notes (Signed)
Patient ambulatory to triage with steady gait, without difficulty or distress noted, mask in place; pt reports dx with flu today and rx levaquin and has had 1ds; c/o chills, fever, prod cough green sputum

## 2018-07-09 NOTE — H&P (Signed)
Samuel Krause NAME: Samuel Krause    MR#:  [id number]  DATE OF BIRTH:  11/10/1955  DATE OF ADMISSION:  07/09/2018  PRIMARY CARE PHYSICIAN: Cyndi Bender, PA-C   REQUESTING/REFERRING PHYSICIAN: Dr Shirl Harris  CHIEF COMPLAINT:   Shortness of breath cough, low blood pressure HISTORY OF PRESENT ILLNESS:  Samuel Krause  is a 63 y.o. male with a known history of COPD not on oxygen, recently diagnosed with influenza B (did not get Tamiflu since more than three days of illness) was asked by primary care physician to come to the ER since patient was noted to have low blood pressure in PCPs office. He continues to have cough. No fever. Patient blood pressure was in the upper 90s. He received IV fluids. During my evaluation blood pressure was 124/70. Heart rate was stable. He is being admitted with relative hypotension and acute bronchitis. pt was initially wheezing. Received IV Solu-Medrol in the ER.  PAST MEDICAL HISTORY:   Past Medical History:  Diagnosis Date  . Emphysema lung ([place])     PAST SURGICAL HISTOIRY:  History reviewed. No pertinent surgical history.  SOCIAL HISTORY:   Social History   Tobacco Use  . Smoking status: Current Every Day Smoker  . Smokeless tobacco: Never Used  Substance Use Topics  . Alcohol use: Not on file    FAMILY HISTORY:  No family history on file.  DRUG ALLERGIES:  No Known Allergies  REVIEW OF SYSTEMS:  Review of Systems  Constitutional: Negative for chills, fever and weight loss.  HENT: Negative for ear discharge, ear pain and nosebleeds.   Eyes: Negative for blurred vision, pain and discharge.  Respiratory: Positive for cough, sputum production and wheezing. Negative for shortness of breath and stridor.   Cardiovascular: Negative for chest pain, palpitations, orthopnea and PND.  Gastrointestinal: Negative for abdominal pain, diarrhea, nausea and vomiting.  Genitourinary: Negative for  frequency and urgency.  Musculoskeletal: Negative for back pain and joint pain.  Neurological: Positive for weakness. Negative for sensory change and speech change.  Psychiatric/Behavioral: Negative for depression and hallucinations. The patient is not nervous/anxious.      MEDICATIONS AT HOME:   Prior to Admission medications   Medication Sig Start Date End Date Taking? Authorizing Provider  amLODipine (NORVASC) 5 MG tablet Take 5 mg by mouth daily. 05/12/18  Yes [provider]  atorvastatin (LIPITOR) 20 MG tablet Take 20 mg by mouth daily.   Yes [provider]  carvedilol (COREG) 12.5 MG tablet Take 12.5 mg by mouth 2 (two) times daily. 04/02/18  Yes [provider]  losartan-hydrochlorothiazide (HYZAAR) 100-25 MG tablet Take 1 tablet by mouth daily. 05/22/18  Yes [provider]  sitaGLIPtin (JANUVIA) 100 MG tablet Take 100 mg by mouth daily.   Yes [provider]      VITAL SIGNS:  Blood pressure (!) 92/57, pulse 64, temperature 98.5 F (36.9 C), temperature source Oral, resp. rate 15, height 5\' 7"  (1.702 m), weight 86.6 kg, SpO2 96 %.  PHYSICAL EXAMINATION:  GENERAL:  63 y.o.-year-old patient lying in the bed with no acute distress.  EYES: Pupils equal, round, reactive to light and accommodation. No scleral icterus. Extraocular muscles intact.  HEENT: Head atraumatic, normocephalic. Oropharynx and nasopharynx clear.  NECK:  Supple, no jugular venous distention. No thyroid enlargement, no tenderness.  LUNGS:coarsebreath sounds bilaterally, mild wheezing,no rales,rhonchi or crepitation. No use of accessory muscles of respiration.  CARDIOVASCULAR: S1, S2 normal. No  murmurs, rubs, or gallops.  ABDOMEN: Soft, nontender, nondistended. Bowel sounds present. No organomegaly or mass.  EXTREMITIES: No pedal edema, cyanosis, or clubbing.  NEUROLOGIC: Cranial nerves II through XII are intact. Muscle strength 5/5 in all extremities. Sensation  intact. Gait not checked.  PSYCHIATRIC: The patient is alert and oriented x 3.  SKIN: No obvious rash, lesion, or ulcer.   LABORATORY PANEL:   CBC Recent Labs  Lab 07/09/18 1932  WBC 11.5*  HGB 14.7  HCT 43.9  PLT 127*   ------------------------------------------------------------------------------------------------------------------  Chemistries  Recent Labs  Lab 07/09/18 1932  NA 135  K 3.3*  CL 95*  CO2 30  GLUCOSE 145*  BUN 46*  CREATININE 1.77*  CALCIUM 8.7*  AST 26  ALT 19  ALKPHOS 62  BILITOT 0.9   ------------------------------------------------------------------------------------------------------------------  Cardiac Enzymes Recent Labs  Lab 07/09/18 1932  TROPONINI <0.03   ------------------------------------------------------------------------------------------------------------------  RADIOLOGY:  Dg Chest Port 1 View  Result Date: 07/09/2018 CLINICAL DATA:  Diagnosed with flu today. On Levaquin. Chills, fever and productive cough. EXAM: PORTABLE CHEST 1 VIEW COMPARISON:  CT chest February 28, 2018 FINDINGS: Cardiomediastinal silhouette is normal. Persistently elevated LEFT hemidiaphragm with similar strandy densities. RIGHT lung is clear. Mild bronchitic changes. No pneumothorax. Soft tissue planes included osseous structures are unchanged. IMPRESSION: 1. Mild bronchitic changes without focal consolidation. 2. Similarly elevated LEFT hemidiaphragm LEFT lung base atelectasis/scarring. Electronically Signed   By: Elon Alas M.D.   On: 07/09/2018 20:12    EKG:    IMPRESSION AND PLAN:   Samuel Krause  is a 63 y.o. male with a known history of COPD not on oxygen, recently diagnosed with influenza B (did not get Tamiflu since more than three days of illness) was asked by primary care physician to come to the ER since patient was noted to have low blood pressure in PCPs office. He continues to have cough. No fever  1. Acute bronchitis in the  setting of COPD exacerbation -continue oral Levaquin -prednisone taper, inhalers, nebulizer, oxygen as needed -chest x-ray no evidence of pneumonia.  2. Recent diagnosis of influenza B - was out of range for treatment with Tamiflu -continue symptomatic management  3. Hypertension -patient had relative hypotension. I will hold off on BP meds tonight  4. DVT prophylaxis subcu Lovenox  All the records are reviewed and case discussed with ED provider.   CODE STATUS: full  TOTAL TIME TAKING CARE OF THIS PATIENT: 50* minutes.    Fritzi Mandes M.D on 07/09/2018 at 10:09 PM  Between 7am to 6pm - Pager - [phone]  After 6pm go to [email or web address] - password [email or web address] [facility]  [facility]  Office  [phone]  CC: Primary care physician; Cyndi Bender, PA-C

## 2018-07-09 NOTE — Progress Notes (Signed)
Family Meeting Note  Advance Directive: no  Today a meeting took place with the pt and wife  Patient came in with cough congestion and dizziness feeling lightheaded was found to be hypotensive improved with IV fluids. Being admitted for acute bronchitis and relative hypotension. Has history of COPD. Code status discussed patient is a full code. Time spent during discussion:16 mins  Fritzi Mandes, MD

## 2018-07-10 LAB — URINALYSIS, ROUTINE W REFLEX MICROSCOPIC
Bacteria, UA: NONE SEEN
Bilirubin Urine: NEGATIVE
Glucose, UA: 150 mg/dL — AB
Ketones, ur: NEGATIVE mg/dL
Leukocytes, UA: NEGATIVE
Nitrite: NEGATIVE
Protein, ur: NEGATIVE mg/dL
Specific Gravity, Urine: 1.008 (ref 1.005–1.030)
Squamous Epithelial / HPF: NONE SEEN (ref 0–5)
pH: 6 (ref 5.0–8.0)

## 2018-07-10 MED ORDER — POTASSIUM CHLORIDE CRYS ER 20 MEQ PO TBCR
40.0000 meq | EXTENDED_RELEASE_TABLET | Freq: Once | ORAL | Status: AC
  Administered 2018-07-10: 40 meq via ORAL
  Filled 2018-07-10: qty 2

## 2018-07-10 MED ORDER — LEVOFLOXACIN 500 MG PO TABS: 500.0000 mg | ORAL_TABLET | Freq: Once | ORAL | Status: DC

## 2018-07-10 MED ORDER — DOXYCYCLINE HYCLATE 100 MG PO TABS
100.0000 mg | ORAL_TABLET | Freq: Two times a day (BID) | ORAL | Status: DC
  Administered 2018-07-10: 100 mg via ORAL
  Filled 2018-07-10 (×3): qty 1

## 2018-07-10 MED ORDER — OSELTAMIVIR PHOSPHATE 30 MG PO CAPS
30.0000 mg | ORAL_CAPSULE | Freq: Two times a day (BID) | ORAL | Status: DC
  Administered 2018-07-10: 30 mg via ORAL
  Filled 2018-07-10 (×3): qty 1

## 2018-07-10 MED ORDER — LEVOFLOXACIN 250 MG PO TABS: 250.0000 mg | ORAL_TABLET | Freq: Every day | ORAL | Status: DC

## 2018-07-10 NOTE — Progress Notes (Addendum)
[facility] NAME: Alexandro Line    MR#:  [id number]  DATE OF BIRTH:  Jul 11, 1955  SUBJECTIVE:  CHIEF COMPLAINT:   Chief Complaint  Patient presents with  . Influenza  Patient seen and evaluated today Has some cough and congestion On oxygen via nasal cannula at 3 L No chest pain  REVIEW OF SYSTEMS:    ROS  CONSTITUTIONAL: No documented fever. Has fatigue, weakness. No weight gain, no weight loss.  EYES: No blurry or double vision.  ENT: No tinnitus. No postnasal drip. No redness of the oropharynx.  RESPIRATORY: Has cough, no wheeze, no hemoptysis. mild dyspnea.  CARDIOVASCULAR: No chest pain. No orthopnea. No palpitations. No syncope.  GASTROINTESTINAL: No nausea, no vomiting or diarrhea. No abdominal pain. No melena or hematochezia.  GENITOURINARY: No dysuria or hematuria.  ENDOCRINE: No polyuria or nocturia. No heat or cold intolerance.  HEMATOLOGY: No anemia. No bruising. No bleeding.  INTEGUMENTARY: No rashes. No lesions.  MUSCULOSKELETAL: No arthritis. No swelling. No gout.  NEUROLOGIC: No numbness, tingling, or ataxia. No seizure-type activity.  PSYCHIATRIC: No anxiety. No insomnia. No ADD.   DRUG ALLERGIES:  No Known Allergies  VITALS:  Blood pressure 135/81, pulse 70, temperature (!) 97.4 F (36.3 C), temperature source Oral, resp. rate 15, height 5\' 7"  (1.702 m), weight 87.7 kg, SpO2 93 %.  PHYSICAL EXAMINATION:   Physical Exam  GENERAL:  63 y.o.-year-old patient lying in the bed with no acute distress.  EYES: Pupils equal, round, reactive to light and accommodation. No scleral icterus. Extraocular muscles intact.  HEENT: Head atraumatic, normocephalic. Oropharynx and nasopharynx clear.  NECK:  Supple, no jugular venous distention. No thyroid enlargement, no tenderness.  LUNGS: Decreased breath sounds bilaterally, scattered rhonchi in both lungs. No use of accessory muscles of respiration.  CARDIOVASCULAR: S1,  S2 normal. No murmurs, rubs, or gallops.  ABDOMEN: Soft, nontender, nondistended. Bowel sounds present. No organomegaly or mass.  EXTREMITIES: No cyanosis, clubbing or edema b/l.    NEUROLOGIC: Cranial nerves II through XII are intact. No focal Motor or sensory deficits b/l.   PSYCHIATRIC: The patient is alert and oriented x 3.  SKIN: No obvious rash, lesion, or ulcer.   LABORATORY PANEL:   CBC Recent Labs  Lab 07/09/18 1932  WBC 11.5*  HGB 14.7  HCT 43.9  PLT 127*   ------------------------------------------------------------------------------------------------------------------ Chemistries  Recent Labs  Lab 07/09/18 1932  NA 135  K 3.3*  CL 95*  CO2 30  GLUCOSE 145*  BUN 46*  CREATININE 1.77*  CALCIUM 8.7*  AST 26  ALT 19  ALKPHOS 62  BILITOT 0.9   ------------------------------------------------------------------------------------------------------------------  Cardiac Enzymes Recent Labs  Lab 07/09/18 1932  TROPONINI <0.03   ------------------------------------------------------------------------------------------------------------------  RADIOLOGY:  Dg Chest Port 1 View  Result Date: 07/09/2018 CLINICAL DATA:  Diagnosed with flu today. On Levaquin. Chills, fever and productive cough. EXAM: PORTABLE CHEST 1 VIEW COMPARISON:  CT chest February 28, 2018 FINDINGS: Cardiomediastinal silhouette is normal. Persistently elevated LEFT hemidiaphragm with similar strandy densities. RIGHT lung is clear. Mild bronchitic changes. No pneumothorax. Soft tissue planes included osseous structures are unchanged. IMPRESSION: 1. Mild bronchitic changes without focal consolidation. 2. Similarly elevated LEFT hemidiaphragm LEFT lung base atelectasis/scarring. Electronically Signed   By: Elon Alas M.D.   On: 07/09/2018 20:12     ASSESSMENT AND PLAN:  63 year old male patient with history of COPD, not on home oxygen currently under hospitalist service for cough and  shortness of  breath  -Acute bronchitis with COPD exacerbation Continue Levaquin antibiotic Prednisone tapering dose Nebulizer treatments and inhaler treatments  -Influenza Oral Tamiflu  -Hypoxia Wean oxygen to room air  -DVT prophylaxis subcu Lovenox daily  -Tobacco abuse Tobacco cessation for 6 minutes counseled Nicotine patch offered  All the records are reviewed and case discussed with Care Management/Social Worker. Management plans discussed with the patient, family and they are in agreement.  CODE STATUS: Full code  DVT Prophylaxis: SCDs  TOTAL TIME TAKING CARE OF THIS PATIENT: 36 minutes.   POSSIBLE D/C IN 1 to 2 DAYS, DEPENDING ON CLINICAL CONDITION.  Saundra Shelling M.D on 07/10/2018 at 2:05 PM  Between 7am to 6pm - Pager - [phone]  After 6pm go to [email or web address] - password [email or web address] [facility]  Office  [phone]  CC: Primary care physician; Cyndi Bender, PA-C  Note: This dictation was prepared with Dragon dictation along with smaller phrase technology. Any transcriptional errors that result from this process are unintentional.

## 2018-07-10 NOTE — Progress Notes (Signed)
Nutrition Brief Note  Patient identified on the Malnutrition Screening Tool (MST) Report  Wt Readings from Last 15 Encounters:  07/09/18 87.7 kg  02/28/18 90.7 kg  02/21/17 90.7 kg  02/15/16 95.3 kg  12/17/14 95.3 kg   Met with patient at bedside. He reports his appetite was decreased for 1-2 days PTA but it is improved to baseline now. He finished most of his breakfast this morning and feels he will be able to continue to eat well. Typically has 3 meals per day and finishes 75-100% of meals. Reports his weight is stable. Noted poor dentition but patient denies any difficulty with chewing food. No subcutaneous fat or muscle wasting found on nutrition-focused physical exam. Patient does not meet criteria for malnutrition. No questions about diet. Patient watches carbohydrate/sugar intake because he has been told he has borderline diabetes.  Body mass index is 30.29 kg/m. Patient meets criteria for Obesity Class I based on current BMI.   Current diet order is carbohydrate modified, patient is consuming approximately 75% of meals at this time. Labs and medications reviewed.   No nutrition interventions warranted at this time. If nutrition issues arise, please consult RD.   Willey Blade, MS, [place], LDN Office: [phone] Pager: [phone] After Hours/Weekend Pager: [phone]

## 2018-07-10 NOTE — Plan of Care (Signed)

## 2018-07-10 NOTE — Progress Notes (Signed)
Patient's wife found me in the hall and said that her husband was leaving. I went in there and talked to him about his plan of care. He said "I don't care". The patient was not violent or yelling. He said that he just wanted to go home. I got Gerald Stabs the charge nurse and he explained plan of care for patient but after pt. Insisted on leaving an AMA paper was signed. He said. "It is nobody's fault except my own". I chatted text with Pyreddy. Pyreddy called the patient's room and spoke with the patient. I then spoke with Pyreddy. Pt was advised again to stay but left. Pt is leaving with grandson he said.  Collier Bullock RN

## 2018-07-10 NOTE — Progress Notes (Signed)
Patient insisted on leaving Hospital against medical advice, MD and nursing supervisor aware, reviewed AMA form with patient his wife and patients nurse Collier Bullock, patient singed AMA form saying he understood everything.  Form signed by patient and  witnessed by patients RN  and charge nurse   Patient left with wife

## 2018-07-11 ENCOUNTER — Other Ambulatory Visit: Payer: Self-pay | Admitting: *Deleted

## 2018-07-11 LAB — HIV ANTIBODY (ROUTINE TESTING W REFLEX): HIV Screen 4th Generation wRfx: NONREACTIVE

## 2018-07-11 NOTE — Discharge Summary (Signed)
  Discharge summary Discharge diagnosis 1.  Acute COPD exacerbation 2.  Acute bronchitis 3.  Influenza infection 4.  Tobacco abuse 5.  Hypoxia  Brief history of present illness and course of hospital stay Samuel Krause  is a 63 y.o. male with a known history of COPD not on oxygen, recently diagnosed with influenza B (did not get Tamiflu since more than three days of illness) was asked by primary care physician to come to the ER since patient was noted to have low blood pressure in PCPs office. He continues to have cough. No fever. Patient blood pressure was in the upper 90s. He received IV fluids. During my evaluation blood pressure was 124/70. Heart rate was stable. He is being admitted with relative hypotension and acute bronchitis.pt was initially wheezing. Received IV Solu-Medrol in the ER.  Patient has continued nebulization treatments and Tamiflu in the hospital.  He received IV Solu-Medrol.  Patient received oral antibiotics during the stay in the hospital.  He signed [place] and left the hospital on 07/10/2018.

## 2018-07-11 NOTE — Patient Outreach (Addendum)
[facility]) Care Management  07/11/2018  Samuel Krause 1955/08/03 [id number]   Transition of care call   Referral received: 07/10/18 Initial outreach: 07/11/18 Insurance: [place] Choice Plan   Subjective: Initial successful telephone call to patient's preferred number in order to complete transition of care assessment; 2 HIPAA identifiers verified. Explained purpose of call and completed transition of care assessment.  Samuel Krause states "I am fine, I don't need anything." He says he has antibiotics that he is taking orally and denies shortness of breath, chest pain or fever.     Objective:  Samuel. Mana Krause was hospitalized at [facility] from 1/29-1/30/2020 for hypoxia related to COPD and Flu B.  Comorbidities include: COPD, Type 2 DM, CAD,  hyperlipidemia, and current tobacco use,  He left against medical advice on 1/30 after signing AMA form in the presence of his wife, who is employed by [employer]. .     Plan:  Reviewed with Samuel Krause symptoms that would require MD notification or return to emergency department.  No ongoing care management needs identified so will close case to [facility] care management services and route successful outreach letter with [facility] pamphlet and 24 Hour Nurse Line Magnet to [facility] clinical pool to be mailed to patient's home address.   Barrington Ellison RN,CCM,CDE [facility] Management Coordinator Office Phone [phone] Office Fax [phone]

## 2018-07-14 LAB — CULTURE, BLOOD (ROUTINE X 2)
Culture: NO GROWTH
Culture: NO GROWTH
Special Requests: ADEQUATE

## 2018-12-10 DEATH — deceased

## 2019-05-01 IMAGING — CT CT CHEST LUNG CANCER SCREENING LOW DOSE W/O CM
2 of 5 series · 15 of 40 positions shown, 18 images · non-contrast
Comparison: Low-dose lung cancer screening CT chest dated
02/21/2017

CLINICAL DATA: 62-year-old male current smoker, with 48 pack-year
history of smoking, for follow-up lung cancer screening

EXAM:
CT CHEST WITHOUT CONTRAST LOW-DOSE FOR LUNG CANCER SCREENING
TECHNIQUE: Multidetector CT imaging of the chest was performed following the
standard protocol without IV contrast.

[Series 3: lung · axial · 0.72mm/px · z∈[-1308,-977]mm · 12 of 365 slices shown, 15 images (1 of 2)]
[im 17/365  mediastinal]
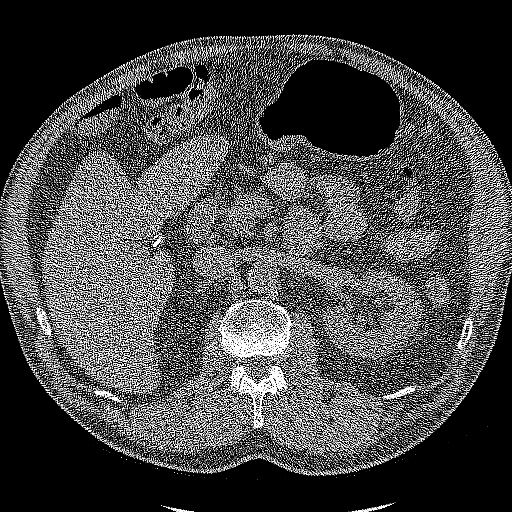
[im 17/365  lung]
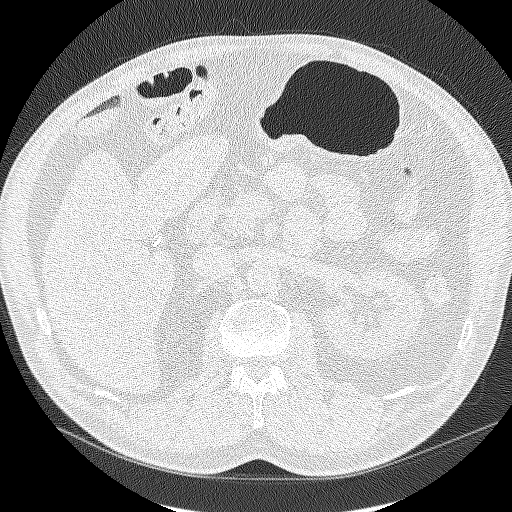
[im 50/365  lung]
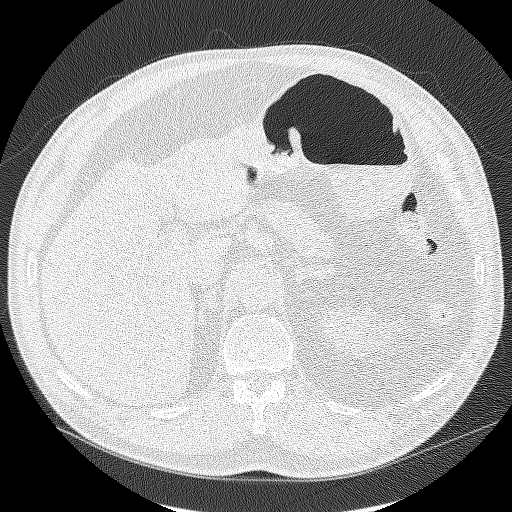
[im 83/365  lung]
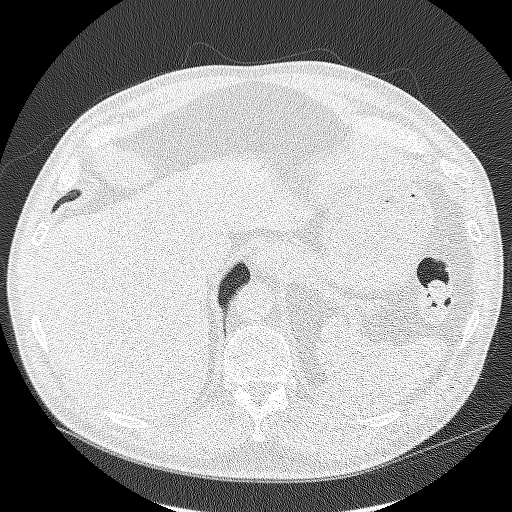
[im 116/365  lung]
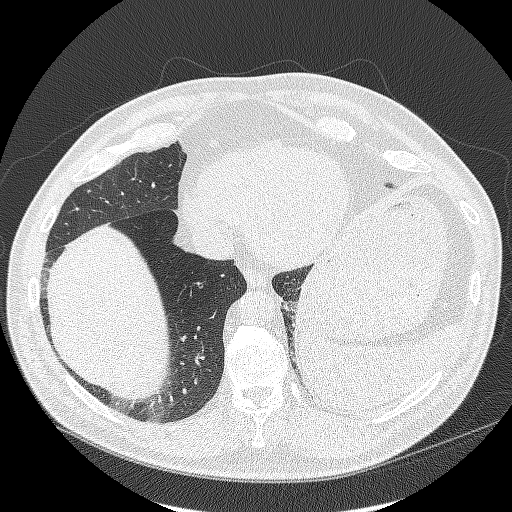
[im 133/365  mediastinal]
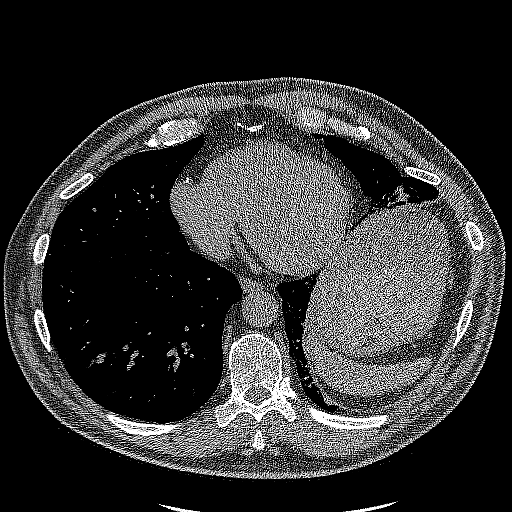
[im 133/365  lung]
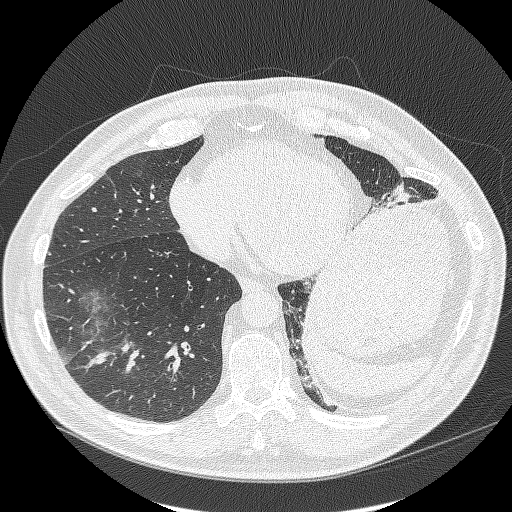
[im 166/365  lung]
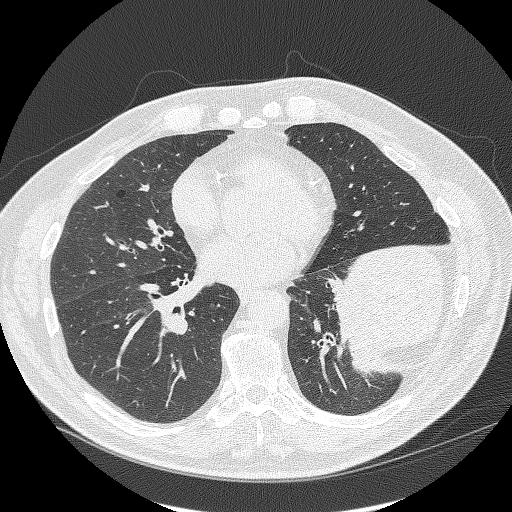
[im 199/365  lung]
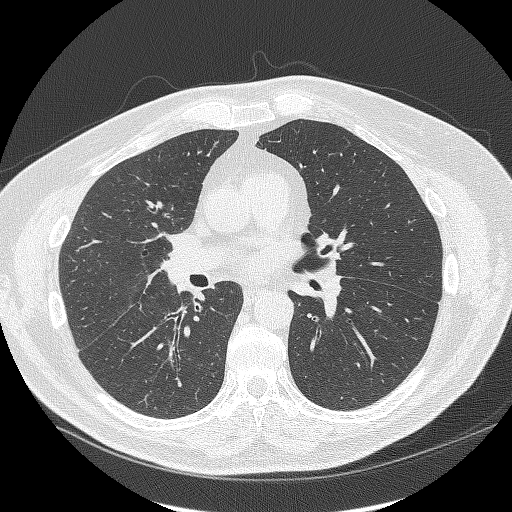
[im 232/365  lung]
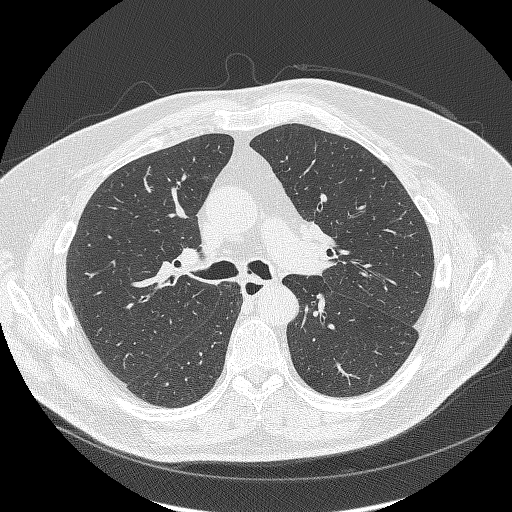
[im 249/365  mediastinal]
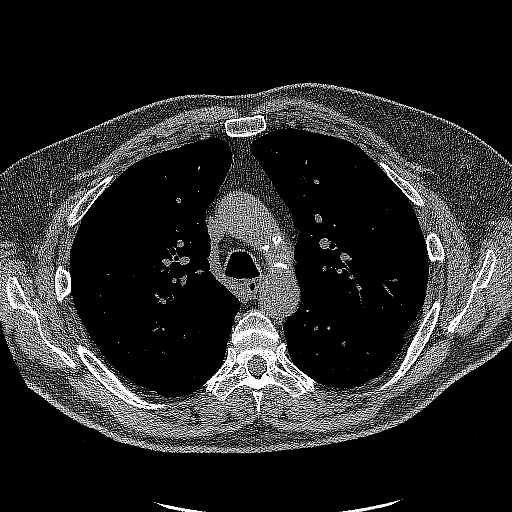
[im 249/365  lung]
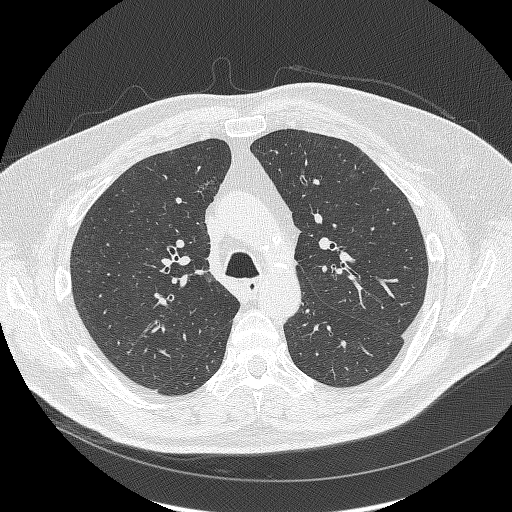
[im 282/365  lung]
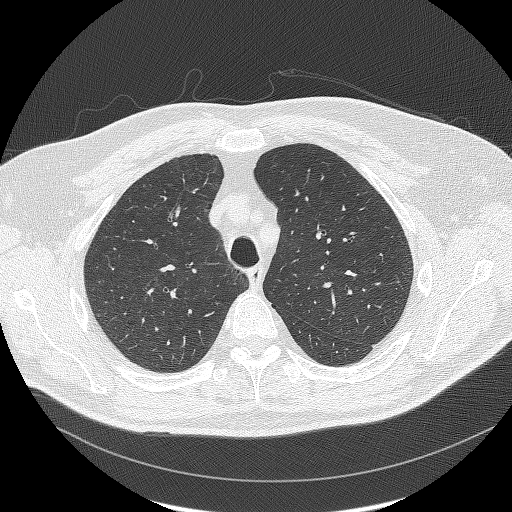
[im 315/365  lung]
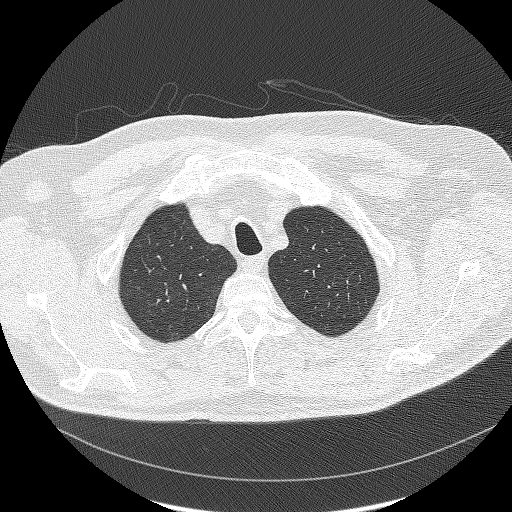
[im 348/365  lung]
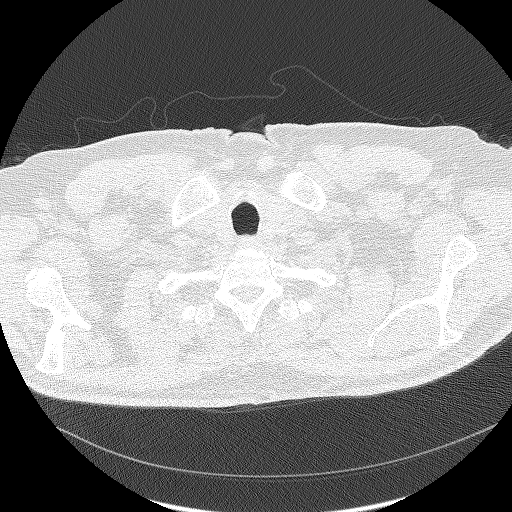

[Series 4: lung · coronal · 0.71mm/px · 3 of 320 slices shown (2 of 2)]
[im 64/320  lung]
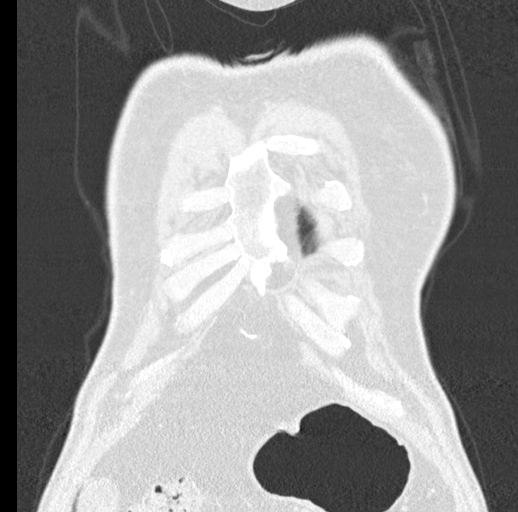
[im 128/320  lung]
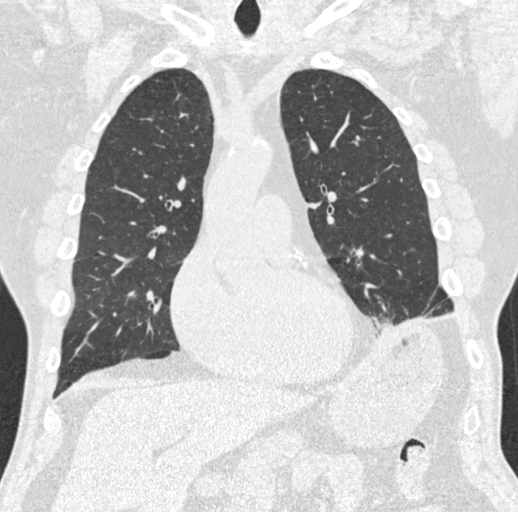
[im 192/320  lung]
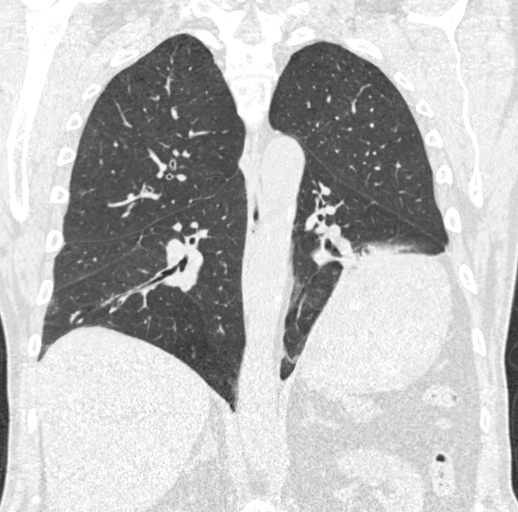

[15 of 40 positions shown; findings below may reference images not displayed]

FINDINGS: Cardiovascular: The heart is normal in size. No pericardial
effusion.

No evidence of thoracic aortic aneurysm. Atherosclerotic
calcifications of the aortic arch.

Three vessel coronary atherosclerosis.

Mediastinum/Nodes: Small mediastinal lymph nodes, including a
dominant 7 mm short axis subcarinal node, within normal limits.

Visualized thyroid is unremarkable.

Lungs/Pleura: Mild mosaic attenuation.  No focal consolidation.

Mild centrilobular and paraseptal emphysematous changes, upper lobe
predominant.

Mildly progressive nodular scarring in the lingula, now measuring
10.6 mm, previously 9.4.

Scattered small bilateral pulmonary nodules measuring up to 2.8 mm.

Eventration of left hemidiaphragm with associated left basilar
scarring/atelectasis.

No pleural effusion or pneumothorax.

Upper Abdomen: Visualized upper abdomen is notable for a 1.8 cm left
adrenal adenoma, prior cholecystectomy, and vascular calcifications.

Musculoskeletal: Mild degenerative changes of the lower thoracic
spine.
IMPRESSION: Lung-RADS 2, benign appearance or behavior. Continue annual
screening with low-dose chest CT without contrast in 12 months.

Mildly progressive nodular scarring in the lingula. Attention at the
time of follow-up lung cancer screening is suggested.

Aortic Atherosclerosis (WL0R0-D59.9) and Emphysema (WL0R0-OZE.2).
